# Patient Record
Sex: Female | Born: 1995 | Race: White | Hispanic: No | Marital: Married | Smoking: Never smoker
Health system: Southern US, Community
[De-identification: ages and names within clinical notes are randomized; demographics above are authoritative.]

---

## 2019-11-28 ENCOUNTER — Ambulatory Visit
Admission: EM | Admit: 2019-11-28 | Discharge: 2019-11-28 | Disposition: A | Discharge status: Home / Self Care | Payer: BC Managed Care – PPO | Attending: Emergency Medicine | Admitting: Emergency Medicine

## 2019-11-28 ENCOUNTER — Ambulatory Visit (INDEPENDENT_AMBULATORY_CARE_PROVIDER_SITE_OTHER): Payer: BC Managed Care – PPO

## 2019-11-28 ENCOUNTER — Other Ambulatory Visit: Payer: Self-pay

## 2019-11-28 ENCOUNTER — Encounter: Payer: Self-pay | Admitting: Emergency Medicine

## 2019-11-28 DIAGNOSIS — M25471 Effusion, right ankle: Secondary | ICD-10-CM

## 2019-11-28 DIAGNOSIS — M25571 Pain in right ankle and joints of right foot: Secondary | ICD-10-CM | POA: Diagnosis not present

## 2019-11-28 MED ORDER — ACETAMINOPHEN 500 MG PO TABS
500.0000 mg | ORAL_TABLET | Freq: Four times a day (QID) | ORAL | 0 refills | Status: AC | PRN
Start: 2019-11-28 — End: ?

## 2019-11-28 MED ORDER — PREDNISONE 10 MG PO TABS
20.0000 mg | ORAL_TABLET | Freq: Every day | ORAL | 0 refills | Status: AC
Start: 2019-11-28 — End: ?

## 2019-11-28 NOTE — Discharge Instructions (Addendum)
Prednisone was prescribed for inflammation Tylenol was prescribed for pain Follow-up with orthopedic for further reevaluation Follow RICE instruction that is attached Return or go to ED for worsening of symptoms

## 2019-11-28 NOTE — ED Triage Notes (Signed)
Pt tripped over a root in the ground and fell heard her R ankle pop. Ankle is painful and swollen.

## 2019-11-28 NOTE — ED Provider Notes (Addendum)
[facility]   [id number] 11/28/19 Arrival Time: 1343   Chief Complaint  Patient presents with  . Ankle Pain     SUBJECTIVE: History from: patient.  Denise Powell is a 24 y.o. female who presents to the urgent care with a complaint of right ankle pain for the past hour.  Reports she tripped over a root and fell and heard a pop sound on her right ankle.  She localized pain and swelling to the right ankle.  She describes the pain as constant and achy.  She has tried OTC medications without relief.  His symptoms are made worse with ROM.  She denies similar symptoms in the past.  Denies chills, fever, nausea, vomiting, diarrhea, confusion, LOC.      ROS: As per HPI.  All other pertinent ROS negative.     History reviewed. No pertinent past medical history. History reviewed. No pertinent surgical history. No Known Allergies No current facility-administered medications on file prior to encounter.   No current outpatient medications on file prior to encounter.   Social History   Socioeconomic History  . Marital status: Married    Spouse name: Not on file  . Number of children: Not on file  . Years of education: Not on file  . Highest education level: Not on file  Occupational History  . Not on file  Tobacco Use  . Smoking status: Never Smoker  . Smokeless tobacco: Never Used  Substance and Sexual Activity  . Alcohol use: Yes    Comment: occ  . Drug use: Never  . Sexual activity: Not on file  Other Topics Concern  . Not on file  Social History Narrative  . Not on file   Social Determinants of Health   Financial Resource Strain:   . Difficulty of Paying Living Expenses:   Food Insecurity:   . Worried About Programme researcher, broadcasting/film/video in the Last Year:   . Barista in the Last Year:   Transportation Needs:   . Freight forwarder (Medical):   Marland Kitchen Lack of Transportation (Non-Medical):   Physical Activity:   . Days of Exercise per Week:   . Minutes of  Exercise per Session:   Stress:   . Feeling of Stress :   Social Connections:   . Frequency of Communication with Friends and Family:   . Frequency of Social Gatherings with Friends and Family:   . Attends Religious Services:   . Active Member of Clubs or Organizations:   . Attends Banker Meetings:   Marland Kitchen Marital Status:   Intimate Partner Violence:   . Fear of Current or Ex-Partner:   . Emotionally Abused:   Marland Kitchen Physically Abused:   . Sexually Abused:    History reviewed. No pertinent family history.  OBJECTIVE:  Vitals:   11/28/19 1355 11/28/19 1356  BP: 126/89   Pulse: 89   Resp: 17   Temp: 98.5 F (36.9 C)   TempSrc: Oral   SpO2: 97%   Weight:  175 lb (79.4 kg)  Height:  5\' 9"  (1.753 m)     Physical Exam Vitals and nursing note reviewed.  Constitutional:      General: She is not in acute distress.    Appearance: Normal appearance. She is normal weight. She is not ill-appearing, toxic-appearing or diaphoretic.  Cardiovascular:     Rate and Rhythm: Normal rate and regular rhythm.     Pulses: Normal pulses.     Heart sounds: Normal  heart sounds. No murmur heard.  No friction rub. No gallop.   Pulmonary:     Effort: Pulmonary effort is normal. No respiratory distress.     Breath sounds: Normal breath sounds. No stridor. No wheezing, rhonchi or rales.  Chest:     Chest wall: No tenderness.  Musculoskeletal:        General: Swelling and tenderness present.     Right ankle: Swelling present. Tenderness present.     Left ankle: Normal.     Comments: Patient is unable to bear weight.  The right ankle is with obvious deformity when compared to the left ankle.  Patient is unable to flex/asked, invert/evert.  No surface trauma, ecchymosis.  Neurovascular status intact  Neurological:     Mental Status: She is alert and oriented to person, place, and time.     Cranial Nerves: No cranial nerve deficit.     Sensory: No sensory deficit.     Motor: No weakness.      Coordination: Coordination normal.     Gait: Gait normal.    LABS:  No results found for this or any previous visit (from the past 24 hour(s)).   ASSESSMENT & PLAN:  1. Acute right ankle pain     Meds ordered this encounter  Medications  . predniSONE (DELTASONE) 10 MG tablet    Sig: Take 2 tablets (20 mg total) by mouth daily.    Dispense:  15 tablet    Refill:  0  . acetaminophen (TYLENOL) 500 MG tablet    Sig: Take 1 tablet (500 mg total) by mouth every 6 (six) hours as needed.    Dispense:  30 tablet    Refill:  0   Patient is stable at discharge.  Right ankle x-ray is negative for bony abnormality including fracture or dislocation.  I have reviewed the x-ray myself and the radiologist interpretation.  I am in agreement with the radiologist interpretation.  While there is no fracture, there was a concern of ligament tear in the right ankle joint.  Therefore ASO was applied.  Discharge Instructions Prednisone was prescribed for inflammation Tylenol was prescribed for pain Follow-up with orthopedic for further reevaluation Follow RICE instruction that is attached Return or go to ED for worsening of symptoms  Reviewed expectations re: course of current medical issues. Questions answered. Outlined signs and symptoms indicating need for more acute intervention. Patient verbalized understanding. After Visit Summary given.      Note: This document was prepared using Dragon voice recognition software and may include unintentional dictation errors.    Durward Parcel, FNP 11/28/19 1438    Durward Parcel, FNP 11/28/19 1440

## 2021-09-17 IMAGING — DX DG ANKLE COMPLETE 3+V*R*
3 series · 3 of 3 positions shown · non-contrast
Comparison: None.

CLINICAL DATA: Ankle pain and lateral swelling following twisting
injury today.

EXAM:
RIGHT ANKLE - COMPLETE 3+ VIEW

[ankle ap]
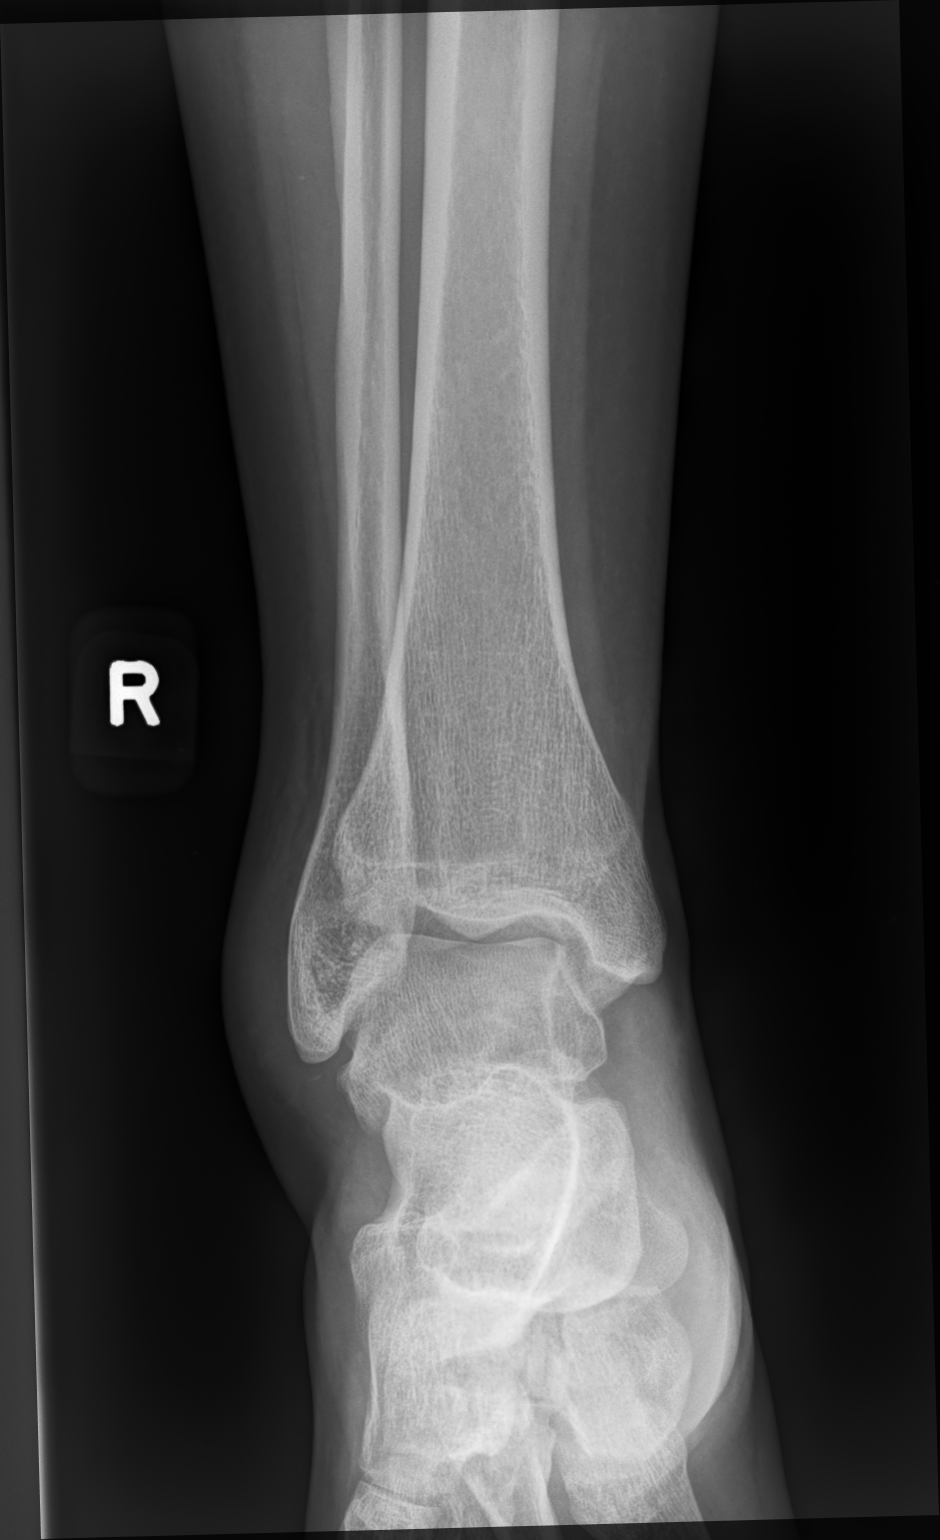

[ankle mlo]
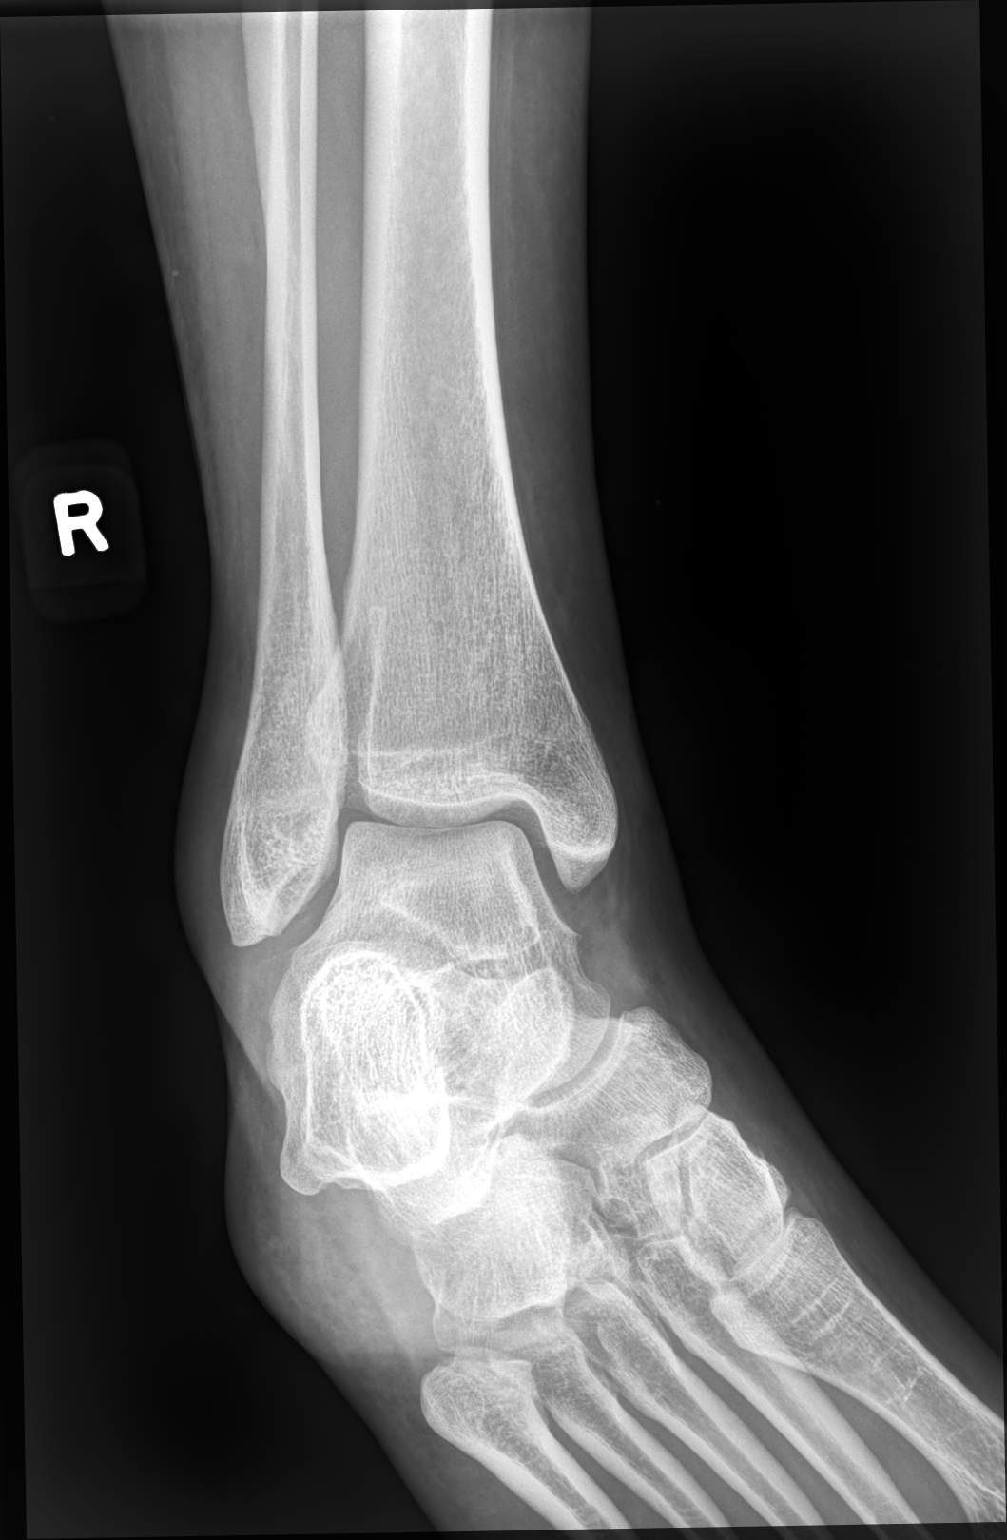

[ankle lat]
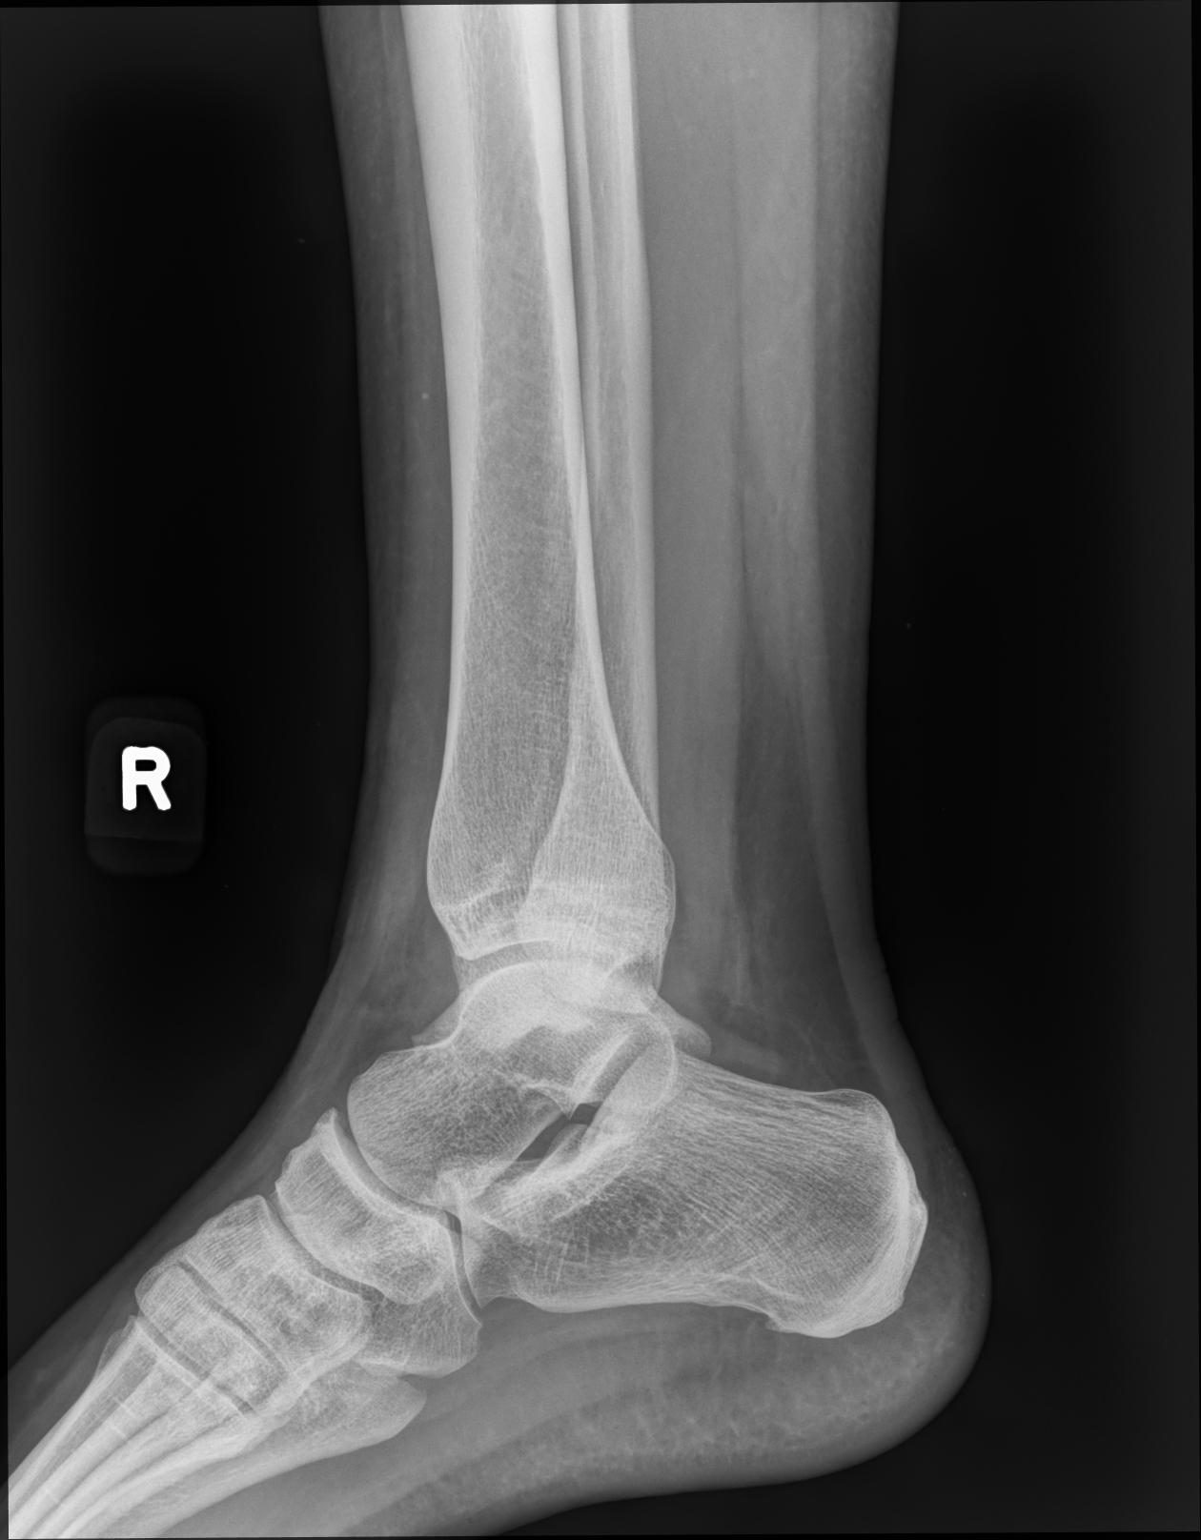

[3 of 3 positions shown; findings below may reference images not displayed]

FINDINGS: The mineralization and alignment are normal. There is no evidence of
acute fracture or dislocation. The joint spaces are preserved. There
is moderate lateral soft tissue swelling without evidence of foreign
body.
IMPRESSION: Lateral soft tissue swelling without evidence of acute osseous
findings.
